# Patient Record
Sex: Male | Born: 1985 | Race: White | Hispanic: No | Marital: Married | Smoking: Former smoker
Health system: Southern US, Community
[De-identification: ages and names within clinical notes are randomized; demographics above are authoritative.]

## PROBLEM LIST (undated history)

## (undated) DIAGNOSIS — J302 Other seasonal allergic rhinitis: Secondary | ICD-10-CM

## (undated) DIAGNOSIS — K649 Unspecified hemorrhoids: Secondary | ICD-10-CM

## (undated) HISTORY — DX: Other seasonal allergic rhinitis: J30.2

## (undated) HISTORY — PX: ADENOIDECTOMY: SUR15

## (undated) HISTORY — DX: Unspecified hemorrhoids: K64.9

## (undated) HISTORY — PX: APPENDECTOMY: SHX54

---

## 2021-08-29 ENCOUNTER — Other Ambulatory Visit: Payer: Self-pay | Admitting: Family Medicine

## 2021-08-29 ENCOUNTER — Other Ambulatory Visit (HOSPITAL_COMMUNITY): Payer: Self-pay | Admitting: Family Medicine

## 2021-08-29 DIAGNOSIS — N50812 Left testicular pain: Secondary | ICD-10-CM

## 2021-09-01 ENCOUNTER — Other Ambulatory Visit: Payer: Self-pay

## 2021-09-01 ENCOUNTER — Ambulatory Visit
Admission: RE | Admit: 2021-09-01 | Discharge: 2021-09-01 | Disposition: A | Discharge status: Home / Self Care | Source: Ambulatory Visit | Attending: Family Medicine | Admitting: Family Medicine

## 2021-09-01 DIAGNOSIS — N50811 Right testicular pain: Secondary | ICD-10-CM | POA: Insufficient documentation

## 2021-09-01 DIAGNOSIS — N50812 Left testicular pain: Secondary | ICD-10-CM | POA: Diagnosis present

## 2021-09-17 ENCOUNTER — Encounter: Payer: Self-pay | Admitting: Urology

## 2021-09-17 ENCOUNTER — Ambulatory Visit (INDEPENDENT_AMBULATORY_CARE_PROVIDER_SITE_OTHER): Admitting: Urology

## 2021-09-17 ENCOUNTER — Other Ambulatory Visit: Payer: Self-pay

## 2021-09-17 VITALS — BP 181/70 | HR 70 | Ht 70.0 in | Wt 190.0 lb

## 2021-09-17 DIAGNOSIS — M6289 Other specified disorders of muscle: Secondary | ICD-10-CM

## 2021-09-17 DIAGNOSIS — N50819 Testicular pain, unspecified: Secondary | ICD-10-CM

## 2021-09-17 LAB — URINALYSIS, COMPLETE
Bilirubin, UA: NEGATIVE
Glucose, UA: NEGATIVE
Ketones, UA: NEGATIVE
Leukocytes,UA: NEGATIVE
Nitrite, UA: NEGATIVE
Protein,UA: NEGATIVE
RBC, UA: NEGATIVE
Specific Gravity, UA: 1.01 (ref 1.005–1.030)
Urobilinogen, Ur: 0.2 mg/dL (ref 0.2–1.0)
pH, UA: 6 (ref 5.0–7.5)

## 2021-09-17 LAB — MICROSCOPIC EXAMINATION: Bacteria, UA: NONE SEEN

## 2021-09-17 MED ORDER — CELECOXIB 200 MG PO CAPS: 200.0000 mg | ORAL_CAPSULE | Freq: Two times a day (BID) | ORAL | 0 refills | Status: AC

## 2021-09-17 NOTE — Progress Notes (Signed)
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  There were no aggravating factors.  It seems to be worse at night with a deep aching and sharp groin pain.  He also has having some urinary frequency and some urgency.  He denies any gross hematuria or dysuria.  He denies any similar episodes in the past.  He is adopted and family history unknown.  Urinalysis and STD testing were negative, and scrotal ultrasound 11/7 was benign.   Social History:  reports that he has quit smoking. His smoking use included cigarettes. He has quit using smokeless tobacco. He reports current alcohol use of about 1.0 standard drink per week. He reports that he does not use drugs.  Physical Exam: BP (!) 181/70 (BP Location: Left Arm, Patient Position: Sitting, Cuff Size: Normal)   Pulse 70   Ht 5\' 10"  (1.778 m)   Wt 190 lb (86.2 kg)   BMI 27.26 kg/m    Constitutional:  Alert and oriented, No acute distress. Cardiovascular: No clubbing, cyanosis, or edema. Respiratory: Normal respiratory effort, no increased work of breathing. GI: Abdomen is soft, nontender, nondistended, no abdominal masses GU: Circumcised phallus with patent meatus, no lesions, testicles 20 cc and descended bilaterally without masses, nontender, no large hydrocele or varicocele on exam  Laboratory Data: Reviewed, see HPI  Pertinent Imaging: I have personally viewed and interpreted the scrotal ultrasound showing trace hydroceles, small varicoceles.  Assessment & Plan:   35 year old male with scrotal/groin and pelvic pain for 6 to 8 weeks as well as urinary urgency and frequency.  Work-up with scrotal ultrasound, STD testing, and urinalysis have all been benign.  His symptoms are most consistent with pelvic floor  dysfunction, and we reviewed this at length.  I recommended a 10-day course of Celebrex twice daily, as well as referral to pelvic for physical therapy.  We discussed possible need for additional imaging in the future if he has no improvement with the above strategies, and return precautions were discussed extensively.  31, MD 09/17/2021  [facility] [address], [address] [place], [place] [phone]

## 2021-09-17 NOTE — Patient Instructions (Signed)
Pelvic Floor Dysfunction, Male   Pelvic floor dysfunction (PFD) is a condition that results when the group of muscles and connective tissues that support the organs in the pelvis (pelvic floor muscles) do not work well. These muscles and their connections form a sling that supports the colon and bladder. In men, these muscles also support the prostate gland. PFD causes pelvic floor muscles to be too weak, too tight, or both. In PFD, muscle movements are not coordinated. This may cause bowel or bladder problems. It may also cause pain. What are the causes? This condition may be caused by an injury to the pelvic area or by a weakening of pelvic muscles. In many cases, the exact cause is not known. What increases the risk? The following factors may make you more likely to develop PFD: Having chronic bladder tissue inflammation (interstitial cystitis). Being an older person. Being overweight. History of radiation treatment for cancer in the pelvic region. Previous pelvic surgery, such as removal of the prostate gland (prostatectomy). What are the signs or symptoms? Symptoms of this condition vary and may include: Bladder symptoms, such as: Trouble starting urination and emptying the bladder. Frequent urinary tract infections. Leaking urine when coughing, laughing, or exercising (stress incontinence). Having to pass urine urgently or frequently. Pain when passing urine. Bowel symptoms, such as: Constipation. Urgent or frequent bowel movements. Incomplete bowel movements. Painful bowel movements. Leaking stool or gas. Unexplained genital or rectal pain. Genital or rectal muscle spasms. Low back pain. Sexual dysfunction, such as erectile dysfunction, premature ejaculation, or pain during or after sexual activity. How is this diagnosed? This condition is diagnosed based on: Your symptoms and medical history. A physical exam. During the exam, your health care provider may check your pelvic  muscles for tightness, spasm, pain, or weakness. This may include a rectal exam. In some cases, you may have diagnostic tests, such as: Electrical muscle function tests. Urine flow testing. X-ray tests of bowel function. Ultrasound of the pelvic organs. How is this treated? Treatment for this condition depends on your symptoms. Treatment options include: Physical therapy. This may include Kegel exercises to help relax or strengthen the pelvic floor muscles. Biofeedback. This type of therapy provides feedback on how tight your pelvic floor muscles are so that you can learn to control them. Massage therapy. A treatment that involves electrical stimulation of the pelvic floor muscles to help control pain (transcutaneous electrical nerve stimulation, or TENS). Sound wave therapy (ultrasound) to reduce muscle spasms. Medicines, such as: Muscle relaxants. Bladder control medicines. Surgery to reconstruct or support pelvic floor muscles may be an option if other treatments do not help. Follow these instructions at home: Activity Do your usual activities as told by your health care provider. Ask your health care provider if you should modify any activities. Do pelvic floor strengthening or relaxing exercises at home as told by your physical therapist. Lifestyle Maintain a healthy weight. Eat foods that are high in fiber, such as beans, whole grains, and fresh fruits and vegetables. Limit foods that are high in fat and processed sugars, such as fried or sweet foods. Manage stress with relaxation techniques such as yoga or meditation. General instructions If you have problems with leakage: Use absorbable pads or wear padded underwear. Wash your genital and anal area frequently with mild soap. Keep your genital and anal area as clean and dry as possible. Ask your health care provider if you should try a barrier cream to prevent skin irritation. Take warm baths to relieve   pelvic muscle tension or  spasms. Take over-the-counter and prescription medicines only as told by your health care provider. Keep all follow-up visits. How is this prevented? The cause of PFD is not always known, but there are a few things you can do to reduce the risk of developing this condition, including: Staying at a healthy weight. Getting regular exercise. Managing stress. Contact a health care provider if: Your symptoms are not improving with home care. You have signs or symptoms of PFD that get worse. You develop new signs or symptoms. You have signs of a urinary tract infection, such as: Fever. Chills. Increased urinary frequency. A burning feeling when urinating. You have not had a bowel movement in 3 days (constipation). Summary Pelvic floor dysfunction results when the muscles and connective tissues in your pelvic floor do not work well. These muscles and their connections form a sling that supports your colon and bladder. In men, these muscles also support the prostate gland. PFD may be caused by an injury to the pelvic area or by a weakening of pelvic muscles. PFD causes pelvic floor muscles to be too weak, too tight, or a combination of both. Symptoms may vary from person to person. In most cases, PFD can be treated with physical therapies and medicines. Surgery may be an option if other treatments do not help. This information is not intended to replace advice given to you by your health care provider. Make sure you discuss any questions you have with your health care provider. Document Revised: 02/19/2021 Document Reviewed: 02/19/2021 Elsevier Patient Education  2022 [employer]  

## 2022-12-09 IMAGING — US US SCROTUM W/ DOPPLER COMPLETE
2 series · 13 of 25 positions shown · non-contrast
Comparison: None.

CLINICAL DATA: Pain in both testicles.  Scrotal pain for 1 month.

EXAM:
SCROTAL ULTRASOUND
DOPPLER ULTRASOUND OF THE TESTICLES
TECHNIQUE: Complete ultrasound examination of the testicles, epididymis, and
other scrotal structures was performed. Color and spectral Doppler
ultrasound were also utilized to evaluate blood flow to the
testicles.

[Series 1: us scrotum w/ doppler complete · 0.07mm/px · 12 of 33 slices shown (1 of 2)]
[im 1/33]
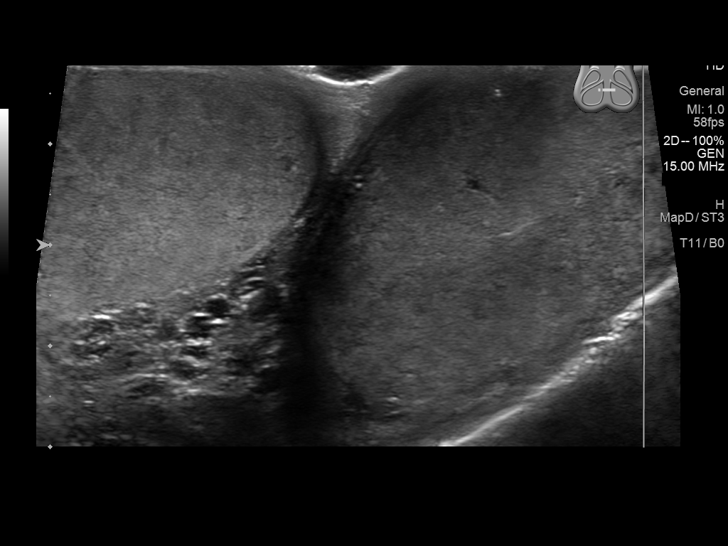
[im 3/33]
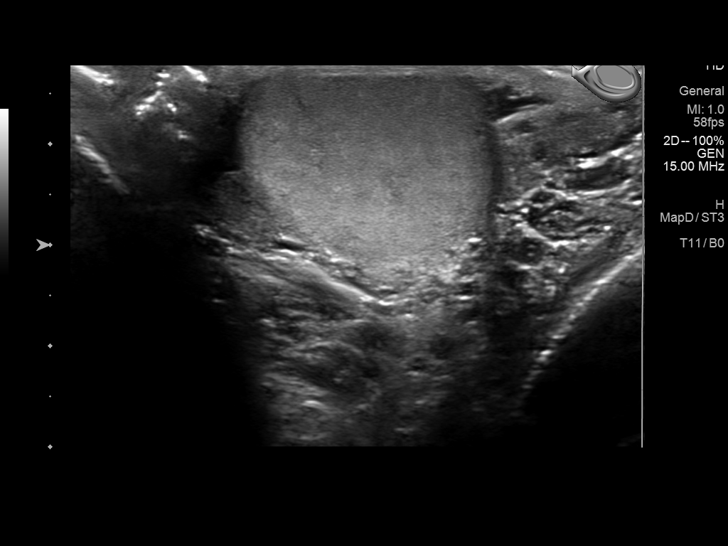
[im 6/33]
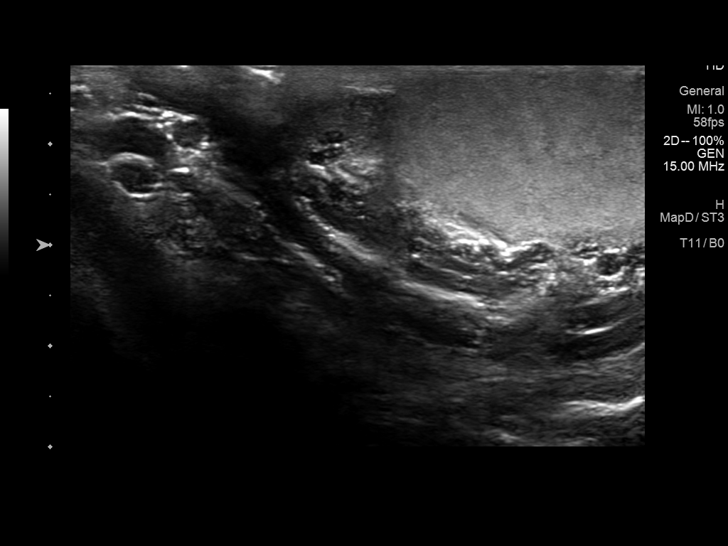
[im 9/33]
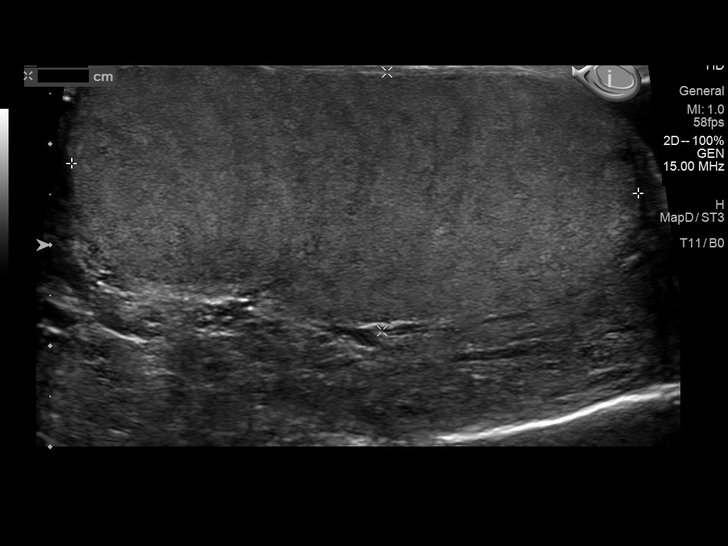
[im 12/33]
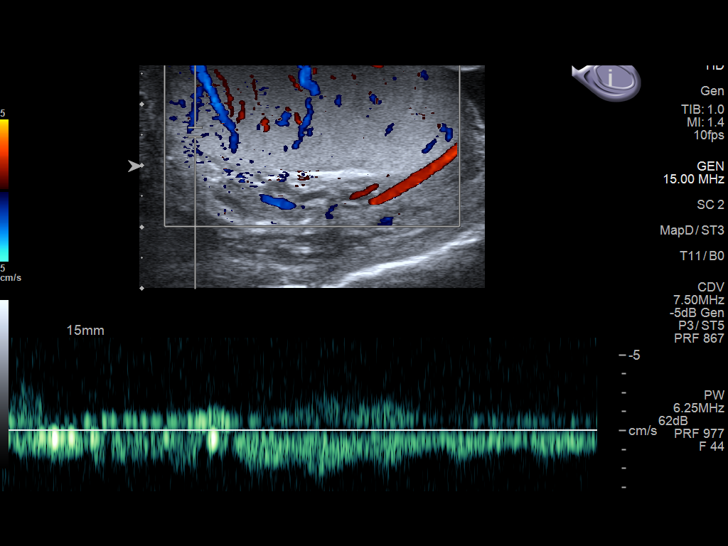
[im 14/33]
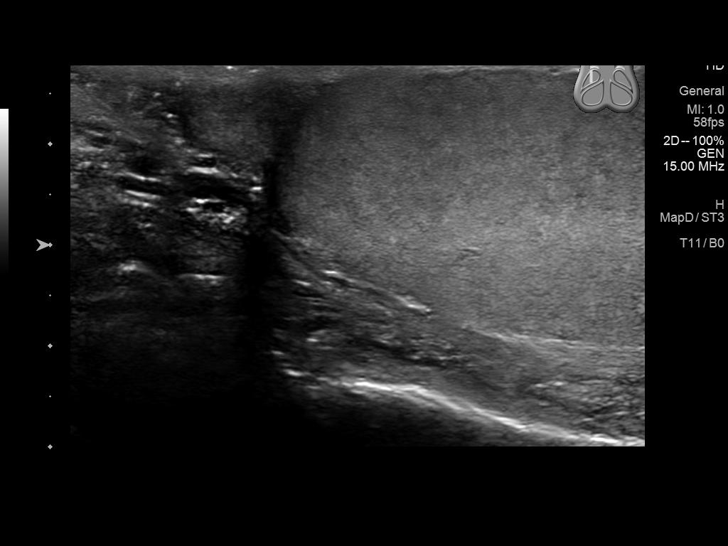
[im 17/33]
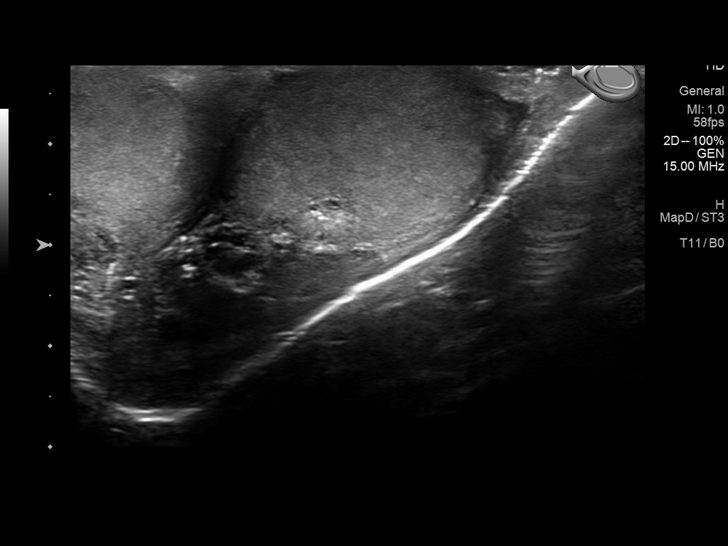
[im 20/33]
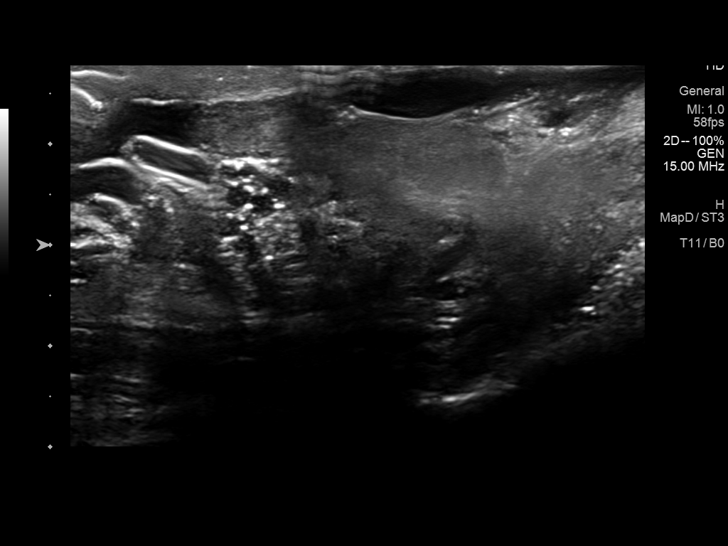
[im 23/33]
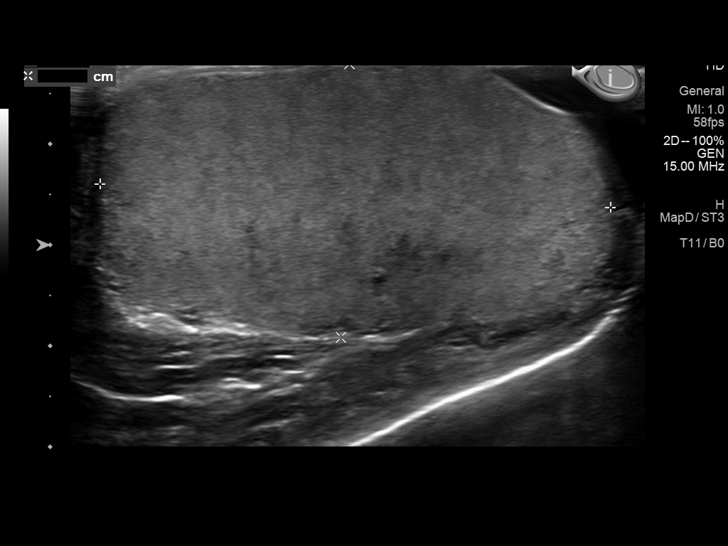
[im 26/33]
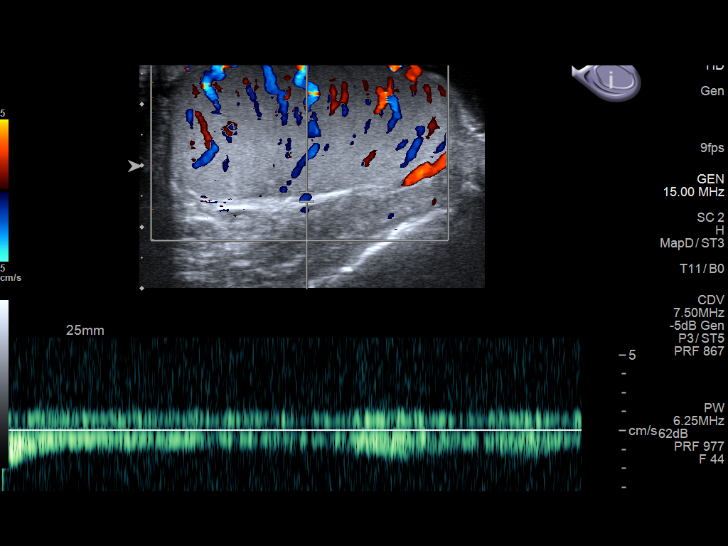
[im 28/33]
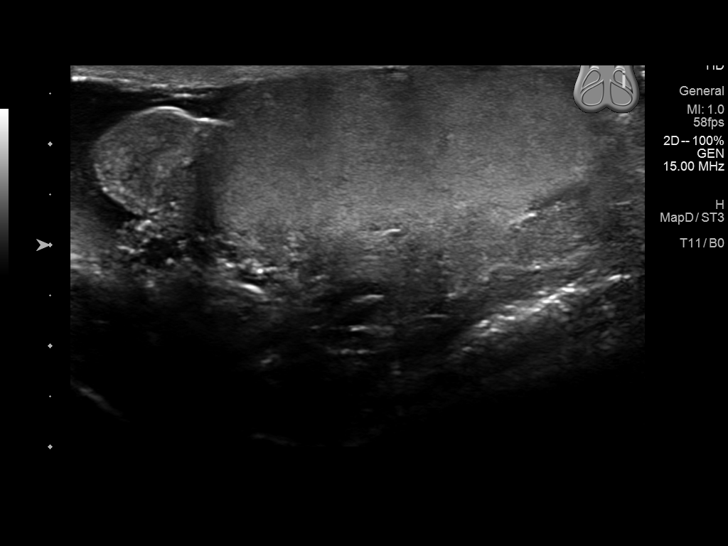
[im 31/33]
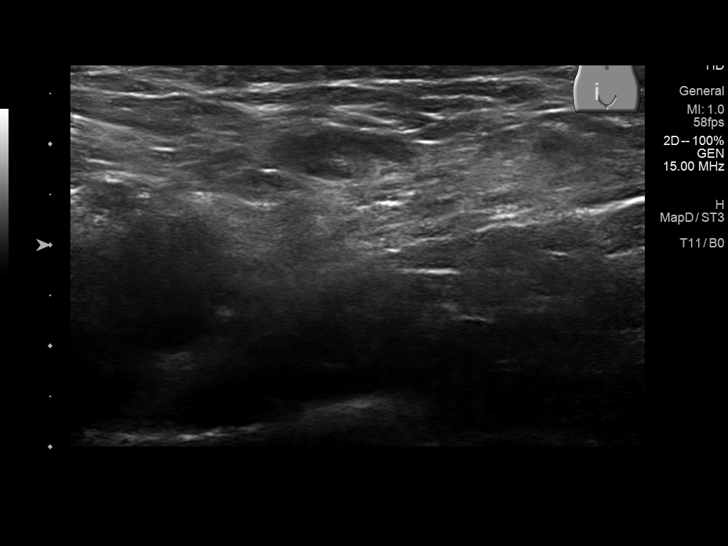

[Series 2: us scrotum w/ doppler complete · 0.07mm/px · 1 of 1 slices shown (2 of 2)]
[im 1/1]
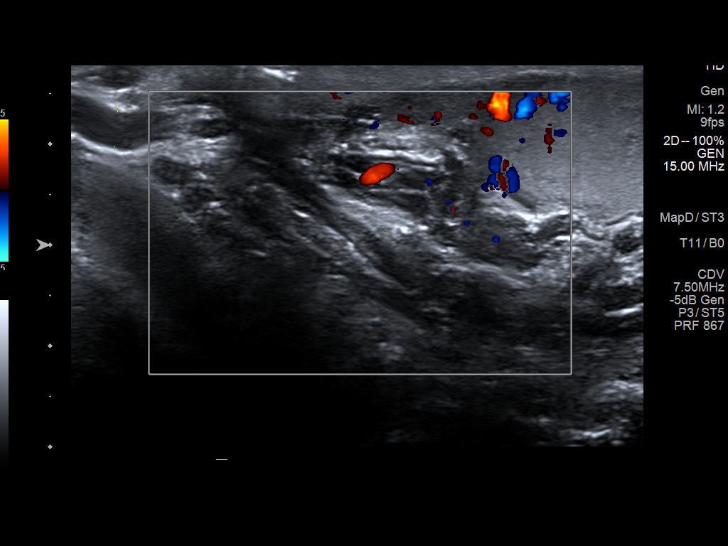

[13 of 25 positions shown; findings below may reference images not displayed]

FINDINGS: Right testicle

Measurements: 5.6 x 2.6 x 3.5 cm. Homogeneous echogenicity. Normal
blood flow. No mass or microlithiasis visualized.

Left testicle

Measurements: 5.0 x 2.7 x 3.0 cm. Homogeneous echogenicity. Normal
blood flow. No mass or microlithiasis visualized.

Right epididymis:  Normal in size and appearance.

Left epididymis:  Normal in size and appearance.

Hydrocele:  Trace bilateral.

Varicocele:  Present bilaterally, slightly larger on the right.

Pulsed Doppler interrogation of both testes demonstrates normal low
resistance arterial and venous waveforms bilaterally.
IMPRESSION: 1. Bilateral varicoceles.
2. Trace bilateral hydroceles.
3. Normal sonographic appearance of both testis and epididymis.
